# Patient Record
Sex: Female | Born: 1935 | Race: Black or African American | Hispanic: No
Health system: Southern US, Community
[De-identification: ages and names within clinical notes are randomized; demographics above are authoritative.]

---

## 2004-09-20 ENCOUNTER — Other Ambulatory Visit: Payer: Self-pay

## 2004-09-20 ENCOUNTER — Emergency Department: Payer: Self-pay | Admitting: Emergency Medicine

## 2004-12-03 ENCOUNTER — Emergency Department: Payer: Self-pay | Admitting: Emergency Medicine

## 2005-09-18 ENCOUNTER — Ambulatory Visit: Payer: Self-pay | Admitting: Ophthalmology

## 2005-10-02 ENCOUNTER — Ambulatory Visit: Payer: Self-pay | Admitting: Ophthalmology

## 2005-10-10 ENCOUNTER — Ambulatory Visit: Payer: Self-pay | Admitting: Ophthalmology

## 2006-12-04 ENCOUNTER — Other Ambulatory Visit: Payer: Self-pay

## 2006-12-04 ENCOUNTER — Emergency Department: Payer: Self-pay | Admitting: Internal Medicine

## 2009-04-17 ENCOUNTER — Emergency Department: Payer: Self-pay | Admitting: Emergency Medicine

## 2010-06-06 ENCOUNTER — Ambulatory Visit: Payer: Self-pay | Admitting: *Deleted

## 2011-05-22 ENCOUNTER — Other Ambulatory Visit (HOSPITAL_COMMUNITY): Payer: Self-pay | Admitting: Internal Medicine

## 2011-05-22 ENCOUNTER — Other Ambulatory Visit (HOSPITAL_COMMUNITY): Payer: Self-pay | Admitting: *Deleted

## 2011-05-22 DIAGNOSIS — Z1231 Encounter for screening mammogram for malignant neoplasm of breast: Secondary | ICD-10-CM

## 2011-05-24 ENCOUNTER — Ambulatory Visit (HOSPITAL_COMMUNITY): Admission: RE | Admit: 2011-05-24 | Payer: Self-pay | Source: Ambulatory Visit

## 2011-05-29 ENCOUNTER — Other Ambulatory Visit: Payer: Self-pay | Admitting: Family Medicine

## 2011-09-20 ENCOUNTER — Emergency Department: Payer: Self-pay | Admitting: Emergency Medicine

## 2013-01-03 ENCOUNTER — Emergency Department: Payer: Self-pay | Admitting: Emergency Medicine

## 2013-01-03 LAB — COMPREHENSIVE METABOLIC PANEL
Albumin: 2.6 g/dL — ABNORMAL LOW (ref 3.4–5.0)
Anion Gap: 8 (ref 7–16)
BUN: 19 mg/dL — ABNORMAL HIGH (ref 7–18)
Calcium, Total: 8 mg/dL — ABNORMAL LOW (ref 8.5–10.1)
Chloride: 101 mmol/L (ref 98–107)
Co2: 24 mmol/L (ref 21–32)
EGFR (African American): 44 — ABNORMAL LOW
EGFR (Non-African Amer.): 38 — ABNORMAL LOW
Potassium: 3.1 mmol/L — ABNORMAL LOW (ref 3.5–5.1)
Total Protein: 6.9 g/dL (ref 6.4–8.2)

## 2013-01-03 LAB — URINALYSIS, COMPLETE
Leukocyte Esterase: NEGATIVE
Ph: 5 (ref 4.5–8.0)
Protein: NEGATIVE
Specific Gravity: 1.008 (ref 1.003–1.030)
WBC UR: 1 /HPF (ref 0–5)

## 2013-01-03 LAB — CBC
HCT: 28.2 % — ABNORMAL LOW (ref 35.0–47.0)
MCH: 26.6 pg (ref 26.0–34.0)
MCHC: 33.3 g/dL (ref 32.0–36.0)
MCV: 80 fL (ref 80–100)
RBC: 3.53 10*6/uL — ABNORMAL LOW (ref 3.80–5.20)
WBC: 4.7 10*3/uL (ref 3.6–11.0)

## 2013-01-03 LAB — TROPONIN I: Troponin-I: <0.02 ng/mL

## 2013-06-22 ENCOUNTER — Emergency Department: Payer: Self-pay | Admitting: Emergency Medicine

## 2013-06-22 LAB — CBC WITH DIFFERENTIAL/PLATELET
BASOS PCT: 0.8 %
Basophil #: 0 10*3/uL (ref 0.0–0.1)
EOS PCT: 1.6 %
Eosinophil #: 0.1 10*3/uL (ref 0.0–0.7)
HCT: 27.7 % — AB (ref 35.0–47.0)
HGB: 9 g/dL — AB (ref 12.0–16.0)
Lymphocyte #: 0.5 10*3/uL — ABNORMAL LOW (ref 1.0–3.6)
Lymphocyte %: 16.1 %
MCH: 28.2 pg (ref 26.0–34.0)
MCHC: 32.5 g/dL (ref 32.0–36.0)
MCV: 87 fL (ref 80–100)
MONO ABS: 0.2 x10 3/mm (ref 0.2–0.9)
MONOS PCT: 7.2 %
NEUTROS PCT: 74.3 %
Neutrophil #: 2.4 10*3/uL (ref 1.4–6.5)
Platelet: 302 10*3/uL (ref 150–440)
RBC: 3.18 10*6/uL — ABNORMAL LOW (ref 3.80–5.20)
RDW: 16.5 % — ABNORMAL HIGH (ref 11.5–14.5)
WBC: 3.2 10*3/uL — AB (ref 3.6–11.0)

## 2013-06-22 LAB — PRO B NATRIURETIC PEPTIDE: B-TYPE NATIURETIC PEPTID: 1010 pg/mL — AB (ref 0–450)

## 2013-06-22 LAB — BASIC METABOLIC PANEL
ANION GAP: 7 (ref 7–16)
BUN: 28 mg/dL — ABNORMAL HIGH (ref 7–18)
CHLORIDE: 101 mmol/L (ref 98–107)
CREATININE: 1.28 mg/dL (ref 0.60–1.30)
Calcium, Total: 8 mg/dL — ABNORMAL LOW (ref 8.5–10.1)
Co2: 22 mmol/L (ref 21–32)
EGFR (Non-African Amer.): 38 — ABNORMAL LOW
GFR CALC AF AMER: 44 — AB
Glucose: 151 mg/dL — ABNORMAL HIGH (ref 65–99)
OSMOLALITY: 269 (ref 275–301)
POTASSIUM: 4.7 mmol/L (ref 3.5–5.1)
SODIUM: 130 mmol/L — AB (ref 136–145)

## 2013-06-22 LAB — TROPONIN I: Troponin-I: <0.02 ng/mL

## 2013-08-07 ENCOUNTER — Ambulatory Visit: Payer: Self-pay | Admitting: Internal Medicine

## 2013-09-03 ENCOUNTER — Inpatient Hospital Stay: Payer: Self-pay | Admitting: Internal Medicine

## 2013-09-03 LAB — BASIC METABOLIC PANEL
Anion Gap: 12 (ref 7–16)
BUN: 76 mg/dL — AB (ref 7–18)
Calcium, Total: 7.7 mg/dL — ABNORMAL LOW (ref 8.5–10.1)
Chloride: 107 mmol/L (ref 98–107)
Co2: 20 mmol/L — ABNORMAL LOW (ref 21–32)
Creatinine: 4.91 mg/dL — ABNORMAL HIGH (ref 0.60–1.30)
EGFR (African American): 9 — ABNORMAL LOW
EGFR (Non-African Amer.): 7 — ABNORMAL LOW
GLUCOSE: 85 mg/dL (ref 65–99)
OSMOLALITY: 299 (ref 275–301)
POTASSIUM: 3.6 mmol/L (ref 3.5–5.1)
Sodium: 139 mmol/L (ref 136–145)

## 2013-09-03 LAB — CBC
HCT: 26.2 % — AB (ref 35.0–47.0)
HGB: 8.5 g/dL — AB (ref 12.0–16.0)
MCH: 30 pg (ref 26.0–34.0)
MCHC: 32.6 g/dL (ref 32.0–36.0)
MCV: 92 fL (ref 80–100)
Platelet: 256 10*3/uL (ref 150–440)
RBC: 2.85 10*6/uL — ABNORMAL LOW (ref 3.80–5.20)
RDW: 14 % (ref 11.5–14.5)
WBC: 3.2 10*3/uL — AB (ref 3.6–11.0)

## 2013-09-03 LAB — PRO B NATRIURETIC PEPTIDE: B-Type Natriuretic Peptide: 2390 pg/mL — ABNORMAL HIGH (ref 0–450)

## 2013-09-03 LAB — TROPONIN I: TROPONIN-I: 0.02 ng/mL

## 2013-09-04 DIAGNOSIS — E8809 Other disorders of plasma-protein metabolism, not elsewhere classified: Secondary | ICD-10-CM

## 2013-09-04 DIAGNOSIS — N179 Acute kidney failure, unspecified: Secondary | ICD-10-CM

## 2013-09-04 DIAGNOSIS — D649 Anemia, unspecified: Secondary | ICD-10-CM

## 2013-09-04 DIAGNOSIS — R609 Edema, unspecified: Secondary | ICD-10-CM

## 2013-09-04 DIAGNOSIS — I319 Disease of pericardium, unspecified: Secondary | ICD-10-CM

## 2013-09-04 LAB — URINALYSIS, COMPLETE
Bilirubin,UR: NEGATIVE
GLUCOSE, UR: NEGATIVE mg/dL (ref 0–75)
KETONE: NEGATIVE
Nitrite: NEGATIVE
Ph: 5 (ref 4.5–8.0)
Protein: NEGATIVE
Specific Gravity: 1.012 (ref 1.003–1.030)
WBC UR: 7 /HPF (ref 0–5)

## 2013-09-04 LAB — CBC WITH DIFFERENTIAL/PLATELET
BASOS PCT: 1 %
Basophil #: 0 10*3/uL (ref 0.0–0.1)
Eosinophil #: 0.1 10*3/uL (ref 0.0–0.7)
Eosinophil %: 3.2 %
HCT: 26.1 % — ABNORMAL LOW (ref 35.0–47.0)
HGB: 8.6 g/dL — ABNORMAL LOW (ref 12.0–16.0)
LYMPHS PCT: 18.5 %
Lymphocyte #: 0.5 10*3/uL — ABNORMAL LOW (ref 1.0–3.6)
MCH: 30.2 pg (ref 26.0–34.0)
MCHC: 33 g/dL (ref 32.0–36.0)
MCV: 92 fL (ref 80–100)
MONOS PCT: 9.7 %
Monocyte #: 0.3 x10 3/mm (ref 0.2–0.9)
NEUTROS ABS: 1.8 10*3/uL (ref 1.4–6.5)
Neutrophil %: 67.6 %
Platelet: 249 10*3/uL (ref 150–440)
RBC: 2.86 10*6/uL — ABNORMAL LOW (ref 3.80–5.20)
RDW: 13.7 % (ref 11.5–14.5)
WBC: 2.7 10*3/uL — ABNORMAL LOW (ref 3.6–11.0)

## 2013-09-04 LAB — BASIC METABOLIC PANEL
ANION GAP: 10 (ref 7–16)
BUN: 77 mg/dL — AB (ref 7–18)
Calcium, Total: 7.7 mg/dL — ABNORMAL LOW (ref 8.5–10.1)
Chloride: 109 mmol/L — ABNORMAL HIGH (ref 98–107)
Co2: 19 mmol/L — ABNORMAL LOW (ref 21–32)
Creatinine: 4.49 mg/dL — ABNORMAL HIGH (ref 0.60–1.30)
EGFR (African American): 10 — ABNORMAL LOW
EGFR (Non-African Amer.): 8 — ABNORMAL LOW
GLUCOSE: 71 mg/dL (ref 65–99)
Osmolality: 297 (ref 275–301)
POTASSIUM: 3.6 mmol/L (ref 3.5–5.1)
Sodium: 138 mmol/L (ref 136–145)

## 2013-09-04 LAB — HEPATIC FUNCTION PANEL A (ARMC)
ALT: 11 U/L — AB (ref 12–78)
AST: 19 U/L (ref 15–37)
Albumin: 1.6 g/dL — ABNORMAL LOW (ref 3.4–5.0)
Alkaline Phosphatase: 101 U/L
BILIRUBIN TOTAL: 0.2 mg/dL (ref 0.2–1.0)
TOTAL PROTEIN: 5.6 g/dL — AB (ref 6.4–8.2)

## 2013-09-04 LAB — TROPONIN I

## 2013-09-04 LAB — HEMOGLOBIN A1C: Hemoglobin A1C: 6 % (ref 4.2–6.3)

## 2013-09-04 LAB — TSH: Thyroid Stimulating Horm: 2.22 u[IU]/mL

## 2013-09-04 LAB — SODIUM, URINE, RANDOM: Sodium, Urine Random: 34 mmol/L (ref 20–110)

## 2013-09-04 LAB — MAGNESIUM: Magnesium: 1.5 mg/dL — ABNORMAL LOW

## 2013-09-04 LAB — OSMOLALITY, URINE: Osmolality: 393 mOsm/kg

## 2013-09-04 LAB — PROTEIN, URINE, RANDOM: Protein, Random Urine: 36 mg/dL — ABNORMAL HIGH (ref 0–12)

## 2013-09-04 LAB — CREATININE, URINE, RANDOM: CREATININE, URINE RANDOM: 120.9 mg/dL (ref 30.0–125.0)

## 2013-09-05 ENCOUNTER — Ambulatory Visit: Payer: Self-pay | Admitting: Urology

## 2013-09-05 LAB — BASIC METABOLIC PANEL
Anion Gap: 9 (ref 7–16)
BUN: 73 mg/dL — ABNORMAL HIGH (ref 7–18)
CO2: 19 mmol/L — AB (ref 21–32)
Calcium, Total: 7.4 mg/dL — ABNORMAL LOW (ref 8.5–10.1)
Chloride: 110 mmol/L — ABNORMAL HIGH (ref 98–107)
Creatinine: 4.32 mg/dL — ABNORMAL HIGH (ref 0.60–1.30)
EGFR (Non-African Amer.): 9 — ABNORMAL LOW
GFR CALC AF AMER: 10 — AB
Glucose: 100 mg/dL — ABNORMAL HIGH (ref 65–99)
Osmolality: 297 (ref 275–301)
Potassium: 3.7 mmol/L (ref 3.5–5.1)
SODIUM: 138 mmol/L (ref 136–145)

## 2013-09-06 LAB — BASIC METABOLIC PANEL
Anion Gap: 11 (ref 7–16)
BUN: 65 mg/dL — ABNORMAL HIGH (ref 7–18)
CALCIUM: 7.4 mg/dL — AB (ref 8.5–10.1)
CO2: 18 mmol/L — AB (ref 21–32)
Chloride: 109 mmol/L — ABNORMAL HIGH (ref 98–107)
Creatinine: 3.61 mg/dL — ABNORMAL HIGH (ref 0.60–1.30)
EGFR (Non-African Amer.): 11 — ABNORMAL LOW
GFR CALC AF AMER: 12 — AB
GLUCOSE: 104 mg/dL — AB (ref 65–99)
OSMOLALITY: 295 (ref 275–301)
POTASSIUM: 3.7 mmol/L (ref 3.5–5.1)
SODIUM: 138 mmol/L (ref 136–145)

## 2013-09-07 ENCOUNTER — Ambulatory Visit: Payer: Self-pay | Admitting: Internal Medicine

## 2013-09-07 LAB — CREATININE, SERUM
CREATININE: 3.25 mg/dL — AB (ref 0.60–1.30)
EGFR (African American): 14 — ABNORMAL LOW
EGFR (Non-African Amer.): 12 — ABNORMAL LOW

## 2013-09-07 LAB — PROTEIN ELECTROPHORESIS(ARMC)

## 2013-09-08 LAB — PLATELET COUNT: Platelet: 199 10*3/uL (ref 150–440)

## 2013-09-08 LAB — UR PROT ELECTROPHORESIS, URINE RANDOM

## 2013-09-08 LAB — CREATININE, SERUM
CREATININE: 2.52 mg/dL — AB (ref 0.60–1.30)
GFR CALC AF AMER: 19 — AB
GFR CALC NON AF AMER: 17 — AB

## 2013-09-10 LAB — PATHOLOGY REPORT

## 2013-10-07 DEATH — deceased

## 2014-07-31 NOTE — Consult Note (Signed)
PATIENT NAME:  Haley Holland, Haley Holland MR#:  [id number] DATE OF BIRTH:  August 02, 1923  DATE OF CONSULTATION:  09/04/2013  UROLOGY CONSULTATION  CONSULTING PHYSICIAN:  Caralyn Guileichard Holland. Edwyna ShellHart, DO  I was asked to see the patient at the behest of, I believe, Dr. Welton FlakesKhan.  The patient comes in with known metastatic uterine cancer.  I see at least one metastasis to her liver and she has a right hydronephrosis with an increasing creatinine of up to 4.7. She is beginning to having urine output. She has been given some stimulation of her heart and medication for weakness. She is known to have heart failure, diabetes. Her creatinine on admission in the ER was 4.9, and it was normal back in March 2015.   PAST MEDICAL HISTORY: Hypertension, diabetes, hyperglycemia, history of uterine cancer status post chemotherapy and radiation. No known drug allergies. No smoking, alcohol or IV drugs. Lives alone, fairly independent. Mother is still alive at the age of 79.  No known medications at home.  REVIEW OF SYSTEMS:  No fever, fatigue, weakness. Lying curled up in bed, very fatigued. She has no blurred vision.  She has no mental problems with Alzheimer's or loss of memory or orientation.  GASTROINTESTINAL: No nausea, vomiting, diarrhea.  CARDIOVASCULAR: Positive for edema and dyspnea on exertion. No chest pain.  RESPIRATORY: No cough, wheezing, hemoptysis.  GENITOURINARY: No dysuria, hematuria. Does have acute on chronic renal failure.  ENDOCRINE: No polyuria or nocturia. HEMATOLOGY:  Anemia.  SKIN: No rashes or lesions.  MUSCULOSKELETAL: She has arthritis that is mostly back and extremities.  NEUROLOGIC: No numbness.  PSYCHIATRIC: She has a very positive psychiatric history and very optimistic.   PHYSICAL EXAM: VITAL SIGNS: Stable. GENERAL: She is an 81 year old female lying in bed on her side with some arthritic-type contractures. She is very thin, cachectic-appearing.  HEENT: Pupils are round, equal and reactive to  light. NECK:  Supple.  LUNGS: Clear to auscultation. EXTREMITIES:  Only 1 to 2 edema. She has been lying. NEUROLOGIC:  Cranial nerves II through XII are intact. Oriented to person, place and time. No fevers or lesions.   IMPRESSION: My plan is to keep her nothing by mouth tonight, as she ate at noon today and I will be gone before 8:00; and 8:00 at night is no time to do a stent. She probably needs a right-sided stent.  With the creatinine coming down, I think she is beginning to void but hydration has probably helped her, plus whatever has been given to her for her heart failure. Her hemoglobin is 8.6, hematocrit 26.1. I think this is probably baseline for her.  Consideration for blood products is certainly an issue at this point with her heart failure and pulmonary edema. But I will order her nothing by mouth midnight and have the urologist consult in the morning.   ____________________________ Caralyn Guileichard Holland. Edwyna ShellHart, DO rdh:ce Holland: 09/04/2013 17:08:43 ET T: 09/04/2013 21:20:53 ET JOB#: [id number]  cc: Caralyn Guileichard Holland. Edwyna ShellHart, DO, <Dictator> RICHARD Holland HART DO ELECTRONICALLY SIGNED 09/11/2013 13:24

## 2014-07-31 NOTE — H&P (Signed)
PATIENT NAME:  Haley Holland, DIGUGLIELMO MR#:  [id number] DATE OF BIRTH:  16-Dec-1923  DATE OF ADMISSION:  09/03/2013   PRIMARY CARE PHYSICIAN:  Dr. Vicenta Dunning   CARDIOLOGY:  Dr. Adrian Blackwater     CHIEF COMPLAINT: Weakness.   HISTORY OF PRESENT ILLNESS: The patient is an 81 year old female with a known history of heart failure, hypertension, diabetes, and uterine cancer who is being admitted for suspected cardiorenal syndrome. The patient has been feeling weak and has been having worsening lower extremity swelling for weeks. Has been followed by home health nurse and she was asked to come to the Emergency Department as she kept feeling worse and more and more weak. While in the ED, she was found to have acute renal failure with creatinine of 4.91, which was normal back in March 2015. She is being admitted for further evaluation and management. The patient reports feeling short of breath at times and unable to do her daily activities of living. She was also found to be hypotensive with blood pressure in 90s over 70s. She denies any chest pain, fever, nausea or vomiting.   PAST MEDICAL HISTORY:  1. Hypertension.  2. Diabetes.  3. Hyperlipidemia.  4. History of uterine cancer status post chemotherapy and radiation therapy.   ALLERGIES: No known drug allergies.   SOCIAL HISTORY: No smoking, alcohol, or  IV  drugs of abuse. She lives alone and has been fairly independent, managing on her own.  FAMILY HISTORY: Mother is still alive at the age of 58. She does have heart disease.   MEDICATIONS AT HOME: None.  MEDICATION ALLERGIES:  No known drug allergies.   REVIEW OF SYSTEMS:  CONSTITUTIONAL: No fever. Positive fatigue and weakness.  EYES: No blurred or double vision.  EARS, NOSE, AND THROAT: No tinnitus, ear pain.  RESPIRATORY: No cough, wheezing, hemoptysis.  CARDIOVASCULAR: Positive for edema and dyspnea on exertion. No chest pain, orthopnea.  GASTROINTESTINAL: No nausea, vomiting,  diarrhea. GENITOURINARY:  No dysuria or hematuria.  ENDOCRINE: No polyuria or nocturia. HEMATOLOGY: Positive for anemia. No easy bruising.  SKIN: No rash or lesion.  MUSCULOSKELETAL: Possible likely arthritis. No muscle cramps.  NEUROLOGIC: No tingling, numbness, weakness.  PSYCHIATRIC: No history of anxiety, depression.   PHYSICAL EXAMINATION:  VITAL SIGNS: Temperature 97.6, respirations 18 per minute, blood pressure 92/65 mmhg.   She is saturating 98% on room air. GENERAL: Patient is an 81 year old female lying in the bed comfortably without any acute distress.  EYES: Pupils equal, round, reactive to light.  Shows no scleral icterus. Extraocular muscles intact.  HEENT: Head atraumatic, normocephalic.  Oropharynx and nasopharynx clear. NECK: Supple, No JVD, No thyroid enlargement or tednerness.  LUNGS:  Clear to auscultation bilaterally. No wheeze, rales, rhonchi.  CARDIOVASCULAR: S1, S2 normal. No murmurs, rubs or gallops.  ABDOMEN: Soft, nontender, nondistended. Bowel sounds present. No Organomegaly appreciated.  EXTREMITIES: 2 to 3+ pedal edema bilateral ankles. No cyanosis or clubbing. NEUROLOGIC: Cranial nerves II-XII intact, Motor strength is 5/5.  Sensations intact.   PSYCHIATRIC:  Patient is alert and oriented x 3.   SKIN:  No obvious rash, lesion or ulcer.  LABORATORY PANEL: Normal BMP except BUN of 76, creatinine 4.91, blood sugar of 85. BNP of 2390. CBC showed white count 3.2, hemoglobin 8.5, hematocrit 26.2, platelets 256,000.   RADIOLOGICAL DATA: Chest x-ray in the ED showed moderate left and small right pleural effusion and atelectasis.   LE Dopplers in the ED showed no evidence of DVT.   EKG has  not been obtained.   IMPRESSION AND PLAN:  1. Suspected cardiorenal syndrome with acute renal failure with underlying congestive heart failure.  We will consult cardiology and nephrology.  We will start her on dopamine at renal dose for helping renal perfusion, avoid  hypotension. 2. Acute renal failure, likely acute tubular necrosis and/or prerenal.  We will obtain renal ultrasound, consult nephrology, avoid any nephrotoxic medications, start her on IV hydration. She is able to urinate so we will hold off putting a Foley. 3. Anemia of chronic disease, likely from underlying cancer. She does have a history of uterine cancer. We will monitor at this time.  4. Suspected congestive heart failure with lower extremity edema, elevated BNP and chest x-ray finding.  We will obtain cardiology consult, discuss with Dr. Adrian BlackwaterShaukat Khan.  We will get 2-D echo. Consider Lasix drip with albumin infusion as per cardiology and nephrology tomorrow as appropriate.  Small left heel ulcer one half to 1 cm, noninfected. Continue dressing changes as needed.  CODE STATUS: DNR.   We will get palliative care consultation.  TOTAL TIME TAKING CARE OF THIS PATIENT: (Critical care) 55 minutes.  She remains at high risk for a cardiorespiratory arrest and will have a poor outcome.  ____________________________ Hiran Leard S. Sherryll BurgerShah, MD vss:dd D: 09/03/2013 19:50:08 ET T: 09/03/2013 21:28:36 ET JOB#: [id number]  cc: Woodrow Dulski S. Sherryll BurgerShah, MD, <Dictator> Jillene Bucksenny C. Arlana Pouchate, MD Laurier NancyShaukat A. Khan, MD  Ellamae SiaVIPUL S [facility] MD ELECTRONICALLY SIGNED 09/04/2013 10:52

## 2014-07-31 NOTE — Discharge Summary (Signed)
PATIENT NAME:  Haley Holland, Haley Holland MR#:  [id number] DATE OF BIRTH:  [birth year]  DATE OF ADMISSION:  09/03/2013 DATE OF DISCHARGE:    PRESENTING COMPLAINT: Weakness, swelling of the legs.   DISCHARGE DIAGNOSES: 1.  Acute renal failure appearing from obstructive uropathy and mild dehydration with right-sided hydronephrosis from pelvic mass effect. The patient is status post ureteral stent placement on May 30th.   Creatinine down to 2.5; creatinine at admission was 4.91.  2.  Hypotension, shock, improved from hypovolemia, improved.  4.  Bilateral lower extremity edema with bilateral pleural effusions, likely secondary to low albumin, large mass effect on large vessels in the pelvis and severe protein calorie malnutrition.  5.  History of known uterine cancer, follows at [facility] gynecology/oncology. The patient has additional left pelvic mass, ascites and liver mass, which appears to be vascular.  6.  History of deep vein thrombosis off Xarelto. The patient has had Doppler in March 2015 and in May 2015 that was negative for deep vein thrombosis.  7.  Generalized weakness, deconditioning and severe protein calorie malnutrition.   CODE STATUS: FULL CODE.   The patient is being discharged to a skilled facility in [facility].   DISCHARGE MEDICATIONS:  1.  Timolol 0.25% 1 drop to both eyes b.i.d.  2.  Ferrous sulfate 325 mg p.o. daily.  3.  Megestrol 40 mg/mL, 10 mL b.i.d.  4.  Acetaminophen 325 mg 2 tablets every 8 hours as needed.  5.  Magnesium oxide 400 mg p.o. daily.  6.  Tetrahydrozoline 0.05% ophthalmic drops to affected eyes every 6 hourly.  7.  Ensure Clear 200 mL 3 times a day.   Physical therapy.   Regular diet.   Follow up with Dr. Arlana Pouch in 2 to 4 weeks.   The patient will follow up with her primary oncologist, Dr. Oneita Jolly at [facility] oncology.   The patient will follow up with urology in 2 to 4 weeks at [facility] regarding followup of right ureteral stent placement.   Creatinine at  discharge is 2.52. Potassium is 3.7. Sodium is 138. Echo Doppler of the heart showed EF 60% to 65%, impaired relaxation pattern of LV diastolic filling, normal global left ventricular systolic function, moderate-sized pericardial effusion, which is anterior to the right ventricle; mild to moderate aortic valve sclerosis without evidence of stenosis. No evidence of pulmonary hypertension.   Ultrasound of kidneys, bilateral, ascites, solid, 4.7 x 4.7 x 5 cm vascular liver mass.   Ultrasound of the pelvis showed 18.9 x 16.3 x 21.9 cm avascular complex solid mass in the pelvis, adjacent to 7.9 x 3.8 x 7.4 mass in the left pelvis concerning for neoplasm.   CHEST X-RAY: Small left pleural effusion with left basilar ascites or infiltrate.   UA negative for UTI.  UPEP M-spike not observed. Total protein urine is 19 mg/dL.    ANA is negative.   SPEP total protein is 5.2. Albumin is 2.0. Betaglobulin is 0.5. Gammaglobulin is 2.1. M-spike not observed. H and H are 8.6 and 26.1. A1c is 6.0. TSH is 2.22. LFTs within normal limits. Albumin is 1.6. Ultrasound Doppler, May 28th, is negative for DVT.  CONSULTATIONS: 1.  Cardiology, Dr. Kirke Corin.  2.  Urology, Dr. Edwyna Shell and Yevonne Aline. 3.  Palliative care, Dr. Harvie Junior.  4.  Nephrology, Dr. Thedore Mins.   BRIEF SUMMARY OF HOSPITAL COURSE: An 81 year old [name] was admitted on May 28th with increasing shortness of breath, weakness and leg swelling. She was found to have:  1.  Acute renal failure, suspected due to obstructive uropathy secondary from known large pelvic and uterine mass effect. The patient has history of CKD stage III, looking at creatinine in March 2015. She, on ultrasound, shows severe right-sided hydronephrosis. She underwent right ureteral stent placement on May 30th. Her creatinine came down from 4.91 at admission to 2.5. She did get IV fluids. The patient will [facility] for [facility] of her ureteral stent in the next 2 to 4 weeks.   2.  Hypotension/hypovolemic shock, improving, suspected from hypovolemia. No signs of infection. Blood pressure is stable. Discontinue the IV fluids.  3.  Bilateral lower extremity edema with bilateral pleural effusions likely from low albumin, large mass effect on large vessels in the pelvis from the pelvic mass, along with protein calorie malnutrition. She does not have CHF.  Her EF is 60% to 65%. The patient was seen by Dr. Kirke CorinArida.  4.  History of uterine cancer with known chemo treatment in the past. Declined any further aggressive treatment. She will [facility] up [facility]. She also has a left pelvic mass, uterine mass, ascites and liver mass, which appears to be vascular.  5.  History of deep vein thrombosis. The patient had been on Xarelto. Her 2 ultrasounds of lower extremities in March and May of 2015 have been negative. She was taken off the blood thinner medication.  6.  Deconditioning, weakness. Physical therapy recommends rehab. The patient is going [place].  7.  Severe protein calorie malnutrition. Calorie count was initiated. The patient was started on Megace, and the patient's appetite was improving slowly.   DISCHARGE DISPOSITION: I had very lengthy conversations with the patient's three daughters. It was a difficult situation since daughters [facility]. Made attempt to transfer to internal medicine and OB/GYN and was declined, since there was no indications for any transfer at this time, given overall patient's  comorbidities and overall prognosis. The patient experienced nursing director on Tuesday and palliative care was involved in the patient's care. Hospital stay otherwise remained stable. The patient remained a FULL CODE.   TIME SPENT:  40 minutes.  ____________________________ Wylie HailSona A. Allena KatzPatel, MD sap:dmm D: 09/09/2013 12:13:32  ET T: 09/09/2013 13:07:42 ET JOB#: [id number]  cc: Kirstan Fentress A. Allena KatzPatel, MD, <Dictator> Willow OraSONA A Havanna Groner MD ELECTRONICALLY SIGNED 09/21/2013 14:16

## 2014-07-31 NOTE — Consult Note (Signed)
Chief Complaint:  Subjective/Chief Complaint naeon. still feels weak. no flank pain   VITAL SIGNS/ANCILLARY NOTES: **Vital Signs.:   31-May-15 08:35  Vital Signs Type Q 4hr  Temperature Temperature (F) 97.4  Celsius 36.3  Temperature Source oral  Pulse Pulse 91  Respirations Respirations 18  Systolic BP Systolic BP 960  Diastolic BP (mmHg) Diastolic BP (mmHg) 66  Mean BP 78  Pulse Ox % Pulse Ox % 96  Pulse Ox Activity Level  At rest  Oxygen Delivery Room Air/ 21 %  *Intake and Output.:   Daily 31-May-15 07:00  Grand Totals Intake:  925 Output:  725    Net:  200 24 Hr.:  200  Oral Intake      In:  0  IV (Primary)      In:  925  Urine ml     Out:  725  Length of Stay Totals Intake:  3190.7 Output:  975    Net:  2215.7   Brief Assessment:  GEN no acute distress, cachectic   Cardiac Regular   Respiratory normal resp effort  clear BS   Gastrointestinal details normal Soft  Nontender  Nondistended  palpable pelvic mass   Lab Results: Routine Chem:  31-May-15 05:30   Glucose, Serum  104  BUN  65  Creatinine (comp)  3.61  Sodium, Serum 138  Potassium, Serum 3.7  Chloride, Serum  109  CO2, Serum  18  Calcium (Total), Serum  7.4  Anion Gap 11  Osmolality (calc) 295  eGFR (African American)  12  eGFR (Non-African American)  11 (eGFR values <78m/min/1.73 m2 may be an indication of chronic kidney disease (CKD). Calculated eGFR is useful in patients with stable renal function. The eGFR calculation will not be reliable in acutely ill patients when serum creatinine is changing rapidly. It is not useful in  patients on dialysis. The eGFR calculation may not be applicable to patients at the low and high extremes of body sizes, pregnant women, and vegetarians.)   Assessment/Plan:  Assessment/Plan:  Assessment 81y/o F with large recurrent uterine cancer causing ureteral obstruction, s/p right ureteral placement with improvement in Cr to 3.6 today from 4.3   Plan -  continue stent and trend Cr. if Cr doesn't come to baseline, will have to consider nephrostomy tube. d/w family and not sure she would want to pursue this. palliative care consult which is ordered may be helpful to help with these decisions if needed in the future - okay to remove foley from urology standpoint - stent is temporary and will need exchange in 3 months. patient can f/u with Dr. HElnoria Howardas outpatient. discussed with patient and daughters that the stent is temporary and needs removal or exchange within 3 months   Electronic Signatures: SCharna Archer(MD)  (Signed 31-May-15 09:41)  Authored: Chief Complaint, VITAL SIGNS/ANCILLARY NOTES, Brief Assessment, Lab Results, Assessment/Plan   Last Updated: 31-May-15 09:41 by SCharna Archer(MD)

## 2014-07-31 NOTE — Consult Note (Signed)
Chief Complaint:  Subjective/Chief Complaint naeon. no flank pain. npo for stent today   VITAL SIGNS/ANCILLARY NOTES: **Vital Signs.:   30-May-15 09:00  Pulse Pulse 82  Respirations Respirations 19  Systolic BP Systolic BP 220  Diastolic BP (mmHg) Diastolic BP (mmHg) 58  Mean BP 73  Pulse Ox % Pulse Ox % 100  Pulse Ox Activity Level  At rest  Oxygen Delivery Room Air/ 21 %   Brief Assessment:  GEN no acute distress, cachectic   Cardiac Regular   Respiratory normal resp effort   Gastrointestinal details normal Soft  Nontender  Nondistended  no CVAT. palpable fullness to lower abd   Lab Results:  Routine Chem:  30-May-15 04:18   Glucose, Serum  100  BUN  73  Creatinine (comp)  4.32  Sodium, Serum 138  Potassium, Serum 3.7  Chloride, Serum  110  CO2, Serum  19  Calcium (Total), Serum  7.4  Anion Gap 9  Osmolality (calc) 297  eGFR (African American)  10  eGFR (Non-African American)  9 (eGFR values <61m/min/1.73 m2 may be an indication of chronic kidney disease (CKD). Calculated eGFR is useful in patients with stable renal function. The eGFR calculation will not be reliable in acutely ill patients when serum creatinine is changing rapidly. It is not useful in  patients on dialysis. The eGFR calculation may not be applicable to patients at the low and high extremes of body sizes, pregnant women, and vegetarians.)   Radiology Results: [place]    29-May-15 12:33, [place] Bilateral  [place] Bilateral   REASON FOR EXAM:    acute renal failure  COMMENTS:       PROCEDURE: [place] - [place]  - Sep 04 2013 12:33PM     CLINICAL DATA:  Acute renal failure.    EXAM:  RENAL/URINARY TRACT ULTRASOUND COMPLETE    COMPARISON:  CT of the abdomen and pelvis December 04, 2006.    FINDINGS:  Right Kidney:  Length: 11.7 cm.  Severe right hydronephrosis.    Left Kidney:    Length: 10.1 cm. Echogenicity within normal limits. No mass or  hydronephrosis  visualized.    Bladder:    Left ureteral jet identified, the right not seen. Patient was unable  to void all involution.    Incidental note of a 4.7 x 4.7 x 5 cm partially characterized  vascular mass within the liver. Anechoic ascites noted.   IMPRESSION:  Severe right hydronephrosis, with absent right bladder ureteral jets  consistent with obstructive uropathy, likely by known pelvic mass.    Ascites.    Solid 4.7 x 4.7 x 5 cm vascular liver mass.    Constellation of findings would be better characterized on CT of the  abdomen and pelvis with contrast, as clinically indicated.      Electronically Signed    By: CElon Alas   On: 09/04/2013 12:53     Verified By: CRicky Ala M.D.,   Assessment/Plan:  Assessment/Plan:  Assessment 81y/o F with metastatic uterine cancer with pelvic mass obstructing right kidney causing AKI and hydro   Plan - will plan for cysto, RPG, and attempt at right stent placement. d/w patient and daughter last night. possibility stent unable to be passed and/or will fail given her malignancy. pt and daughter would like to proceed. discussed risks and benefits including infection, bleeding, damage to organs, inability to pass stent, general risks of anesthesia, possiblity of neph tube at later date. will keep  npo and plan for case this am. will order periop Abx for prophylaxis.   Electronic Signatures: Charna Archer (MD)  (Signed [id number] 09:35)  Authored: Chief Complaint, VITAL SIGNS/ANCILLARY NOTES, Brief Assessment, Lab Results, Radiology Results, Assessment/Plan   Last Updated: 30-May-15 09:35 by Charna Archer (MD)

## 2014-07-31 NOTE — Op Note (Signed)
PATIENT NAME:  Haley Holland, Haley Holland MR#:  [id number] DATE OF BIRTH:  [birth year]  DATE OF PROCEDURE:  09/05/2013  ATTENDING SURGEON: Baxter Flattery, MD  PREOPERATIVE DIAGNOSES: 1. Severe right hydronephrosis.  2. Acute kidney injury.  3. Metastatic uterine cancer.  4. Large pelvic mass.   POSTOPERATIVE DIAGNOSES: 1. Severe right hydronephrosis.  2. Acute kidney injury.  3. Metastatic uterine cancer.  4. Large pelvic mass.   PROCEDURES PERFORMED:  1. Cystourethroscopy with right 6 French x 24-cm Contour ureteral stent placement.  2. Bladder biopsy x 1 with fulguration. 3. Right retrograde pyelogram. 4. Interpretation of urography.  5. Intraoperative fluoroscopy with total time length of 1 hour. 6. Simple Foley catheter placement.   COMPLICATIONS: None.   SPECIMENS: Bladder biopsy x 1.   DRAINS: Ureteral stent as above and 16 French Foley catheter.   ANESTHESIA: sedation.   FINDINGS: The bladder itself was somewhat inflamed likely from the pelvic mass. Her ureters were somewhat distorted in placement and I found the left ureter first and shot a retrograde pyelogram over here which noted no hydronephrosis. On the right side there was moderate hydronephrosis with a significant J hook and a proximal ureter as well as narrowing of her mid and distal ureter likely from external compressions. I was able to get a 6 x 24 stent in good position. I biopsied 1 papillary lesion in the posterior bladder wall.   INDICATIONS FOR SURGERY: Ms. Pullman is a pleasant 81 year old female with a history of metastatic and recurrent uterine cancer, who underwent chemotherapy and radiation many many years ago. She presented to the hospital with generalized fatigue and weakness and was noted to have acute kidney injury with a creatinine of 4.9. A renal ultrasound showed there to be a right hydronephrosis. A pelvic ultrasound showed a large pelvic mass, which is clearly palpable on exam. After discussing the risks  and benefits of the operation and the options the patient and her family elected to proceed with cystoscopy and attempted ureteral stent placement.   OPERATIVE PROCEDURE: After the patient was correctly identified in preoperative holding, she was brought to the operating room and placed supine on the operating table. A preprocedure timeout was called. Cefazolin was administered. SCDs were applied. Conscious sedation was administered and preprocedure timeout was called. The patient was carefully placed in dorsal lithotomy position taking care to pad all pressure points. She was then sterilely prepped and draped in the usual fashion followed by a pre-incision  timeout. Ample lubrication with saline running a 21 French rigid cystoscope was advanced per urethra into the bladder. The bladder itself was somewhat irritated in appearance with some prominent blood vessels and the anatomy was mildly distorted by what appeared to be external compression. She had some friable tissue in her posterior bladder wall and 1 small papillary lesion. I biopsied this and then fulgurated it. I then spent some time attempting to localize her ureteral orifices. They were somewhat difficult to locate because of her distorted bladder anatomy and the first ureteral orifice I found appeared to be the left, but I shot a retrograde pyelogram near it to confirm. There was no hydroureter or hydronephrosis noted on this side. I then spent a little bit more time and ultimately identified the right ureteral orifice. I cannulated this with a sensor wire and passed an open-ended catheter over it. I shot a retrograde pyelogram and the mid and distal ureter were very narrowed and there was no significant passage of contrast proximally. I then repassed  the wire through the open end in an attempt to advance it further. I could not get it beyond the proximal ureter. I advanced an open-ended catheter a little bit further and shot another retrograde and  there was a significant J hook noted here up into a moderately-to-severe hydronephrotic right kidney. I then attempted to advance the wire again and was unable to get this through this. I then tried a glide wire but was unable to get this through the area. I then tried an angled sensor wire and was able to get it through the J hook and then straighten it out with the open-ended catheter. I then passed a 6 French x 24-cm Contour ureteral stent over the wire and up into the kidney. There was good curl noted on fluoroscopy in the renal pelvis and under direct visualization of the bladder. The bladder was then drained. There was no significant hematuria and the urine was in fact clear. A 16-French straight Foley catheter was placed into the bladder. The patient was then awoken from anesthesia without complications and transferred to the recovery room for postoperative care.   POSTOPERATIVE PLAN AND INSTRUCTIONS: I had a long discussion with the family about where to go from here. It remains to be seen whether or not the stent is going to work and I would not be surprised at all if it failed based on the size of her mass and the narrowness of her ureter likely from extrinsic compression. We then have to visit placing a nephrostomy tube and whether or not the patient wants to pursue this especially since she is not undergoing any further treatment for her cancer and this may impair her quality of life that is having a nephrostomy tube in place. I also discussed with them that this stent is temporary and will need to be exchanged in 3 months if it is not pulled out before then. I suggested a palliative care consult to help discuss some of these matters as well. I spent about 25 minutes talking with her daughters and they were appreciative of the care. I will sign her out at the beginning of the week so that the team can follow up on her bladder biopsies.    ____________________________ Lisabeth PickJohn P. Azriel Dancy,  MD jps:lt D: 09/05/2013 11:58:41 ET T: 09/05/2013 20:51:31 ET JOB#: [id number]  cc: Lisabeth PickJohn P. Macsen Nuttall, MD, <Dictator> Aloha GellJOHN P [facility] HospitalELPH MD ELECTRONICALLY SIGNED 09/06/2013 8:52

## 2014-07-31 NOTE — Consult Note (Signed)
Brief Consult Note: Diagnosis: ARF, leg edema.   Patient was seen by consultant.   Consult note dictated.   Comments: No clear evidence of heart failure (no S3, lung congestion or JVD). Awaiting echo results.  Albumin is significantly low which is likely contributing.  Should also consider pelvic /abdomin imaging to exclude IVC compression as a cause.  Continue hydration and low dose Dopamin for now.  Electronic Signatures: Lorine BearsArida, Kimberly Nieland (MD)  (Signed 29-May-15 08:20)  Authored: Brief Consult Note   Last Updated: 29-May-15 08:20 by Lorine BearsArida, Jery Hollern (MD)

## 2014-07-31 NOTE — Consult Note (Signed)
PATIENT NAME:  Haley Holland, Haley Holland MR#:  [id number] DATE OF BIRTH:  Jan 06, 1924  DATE OF CONSULTATION:  09/04/2013  REFERRING PHYSICIAN:  Max Sane, MD CONSULTING PHYSICIAN:  Alenna Russell Lilian Kapur, MD  REASON FOR CONSULTATION: Acute renal failure.   HISTORY OF PRESENT ILLNESS: The patient is a pleasant 81 year old African American female with past medical history of hypertension, diabetes mellitus, hyperlipidemia, history of uterine cancer status post chemotherapy and radiation therapy who presented to [facility] with weakness. The patient reports that the weakness has been rather progressive over the past 3 weeks. She has also had slowly increasing lower extremity edema. When asked about shortness of breath, the patient currently denies this. We are asked to see her for evaluation and management of acute renal failure. It appears that she has some element of chronic kidney disease. Her baseline creatinine is 1.28 with an eGFR of 44. When she presented, creatinine was 4.91 with an eGFR of 9. The patient has been started on IV fluid hydration and creatinine is down to 4.4 with an eGFR of 10. Renal ultrasound is currently pending this a.m. The patient appears to be quite hypoalbuminemic with an albumin of 1.6. She also appears to be anemic with a hemoglobin of 8.6. Urinalysis was performed, which was negative for protein or hematuria.   PAST MEDICAL HISTORY: 1.  Hypertension.  2.  Diabetes mellitus.  3.  Hyperlipidemia.  4.  Uterine cancer status post chemotherapy and radiation therapy.  5.  Chronic kidney disease, stage III, baseline eGFR of 44.   ALLERGIES: No known drug allergies.   CURRENT INPATIENT MEDICATIONS: Include: 1.  Normal saline 0.9 at 75 mL/h.  2.  Dopamine drip.  3.  Tylenol 650 mg p.o. q. 4 hours p.r.n.  4.  Zofran 4 mg IV q. 4 hours p.r.n.  5.  Magnesium sulfate 2 grams IV x1.  6.  Magnesium oxide 400 mg p.o. daily.   SOCIAL HISTORY: The patient lives in  [place]. She is widowed. She denies tobacco, alcohol, or illicit drug use.   FAMILY HISTORY: The patient's mother is alive at age 79. The patient's father is deceased and had history of underlying coronary artery disease.   REVIEW OF SYSTEMS: CONSTITUTIONAL: Denies fevers, chills or weight loss.  EYES: Denies diplopia, blurry vision.  HEENT: Denies headaches or hearing loss. Denies epistaxis.  CARDIOVASCULAR: Denies chest pains or palpitations.  RESPIRATORY: Denies cough, shortness of breath, or hemoptysis.  GASTROINTESTINAL: Denies nausea, vomiting or bloody stools.  GENITOURINARY: Denies frequency, urgency or dysuria.  MUSCULOSKELETAL: Denies joint pain, swelling or redness.  INTEGUMENTARY: Denies skin rashes or lesions.  NEUROLOGIC: Denies focal extremity numbness or weakness.  PSYCHIATRIC: Denies depression, bipolar disorder.  ENDOCRINE: Denies polyuria or polydipsia.  HEMATOLOGIC AND LYMPHATIC: Denies easy bruisability, bleeding, or swollen lymph nodes.  ALLERGY AND IMMUNOLOGIC: Denies seasonal allergies or history of immunodeficiency.   PHYSICAL EXAMINATION: VITAL SIGNS: Temperature 97.6, pulse 74, respirations 20, blood pressure 108/59, pulse ox 93%.  GENERAL: Reveals a slender African American female, currently in no acute distress.  HEENT: Normocephalic, atraumatic. Extraocular movements are intact. Pupils equal, round, and reactive to light. No scleral icterus. Conjunctivae are pink. No epistaxis noted. Gross hearing intact. Oral mucosa moist.  NECK: Supple without JVD or lymphadenopathy.  LUNGS: Clear to auscultation bilaterally with normal respiratory effort.  HEART: S1, S2 regular rate and rhythm. No murmurs, rubs or gallops appreciated.  ABDOMEN: Soft, nontender, nondistended. Bowel sounds positive. No rebound or guarding. No gross organomegaly  appreciated.  EXTREMITIES: No clubbing or cyanosis noted. 2+ pitting edema noted in bilateral lower extremities.  NEUROLOGIC: The  patient is awake, alert and oriented to time, person and place. Strength is 5/5 in both upper and lower extremities.  MUSCULOSKELETAL: No joint redness, swelling or tenderness appreciated.  GENITOURINARY: No suprapubic tenderness at this time.  PSYCHIATRIC: The patient with appropriate affect and appears in good insight into her current illness.   DIAGNOSTIC DATA: Laboratory: Sodium 138, potassium 3.6, chloride 109, CO2 19, BUN 77, creatinine 4.4, total protein 5.6, albumin 1.6, total bilirubin 0.2, direct bilirubin is 1, alkaline phosphatase 101, AST 19, ALT 11. Troponin x3 sets are negative. CBC shows WBC 2.7, hemoglobin 8.6, hematocrit 26, platelets 249,000. Urinalysis is unremarkable and is without proteinuria or hematuria.   IMPRESSION: This is an 81 year old African American female with past medical history of hypertension, diabetes mellitus, hyperlipidemia, history of uterine cancer status post chemotherapy, chronic kidney disease stage III with baseline eGFR of 44 who presented to [facility] with weakness.   PROBLEM LIST: 1.  Acute renal failure.  2.  Chronic kidney disease stage III, baseline eGFR of 44.  3.  Protein calorie malnutrition with an albumin of 1.6.  4.  Anemia, not otherwise specified.  5.  Leukopenia.   PLAN: The patient presents with an interesting case. She has several abnormal findings. In March her creatinine was 1.28 with an eGFR of 44. Renal function is significantly worse now and creatinine was 4.9 upon presentation. However, this is down to 4.4 with hydration. Agree with maintaining the patient on dopamine drip as well as IV fluid hydration for now. She does not appear to be in florid pulmonary edema at this time. However, we are awaiting further input from Dr. Fletcher Anon. Agree with renal ultrasound. We will proceed with additional work-up including SPEP, UPEP and ANA. We would also consider hematology consultation given the leukopenia and anemia at  this time. I would like to thank Dr. Manuella Ghazi for this kind referral. Further plan as the patient progresses.  ____________________________ Tama High, MD mnl:sb D: 09/04/2013 08:40:42 ET T: 09/04/2013 08:49:51 ET JOB#: [id number]  cc: Tama High, MD, <Dictator> Mariah Milling Jacyln Carmer MD ELECTRONICALLY SIGNED 09/07/2013 14:09

## 2014-07-31 NOTE — Consult Note (Signed)
PATIENT NAME:  Haley Holland, Haley Holland MR#:  [id number] DATE OF BIRTH:  1924/03/09  DATE OF CONSULTATION:  09/04/2013  REFERRING PHYSICIAN:  Dr. Manuella Ghazi. CONSULTING PHYSICIAN:  Muhammad A. Fletcher Anon, MD  PRIMARY CARE PHYSICIAN:  Dr. Benita Stabile.  REASON FOR CONSULTATION: Possible congestive heart failure.   HISTORY OF PRESENT ILLNESS: This is an 81 year old female with history of hypertension, diabetes and cervical cancer in the past. She presented to the Emergency Room with generalized weakness. She was found to be in acute renal failure with a creatinine of 4.9. Previous creatinine in March was 1.2. At that time, she presented with lower extremity edema. She was given furosemide. She reports poor oral intake. She denies chest pain or shortness of breath. She is overall a poor historian. She denies orthopnea or PND. She was placed on IV fluids and small dose of dopamine. BNP was only mildly elevated.   PAST MEDICAL HISTORY: 1.  Hypertension.  2.  Diabetes. 3.  Hyperlipidemia.  4.  History of uterine cancer, status post chemotherapy and radiation.   ALLERGIES: No known drug allergies.   SOCIAL HISTORY: Negative for smoking, alcohol or recreational drug use.   FAMILY HISTORY: Negative for heart disease.   HOME MEDICATIONS: None, but she did take furosemide. She claims to take 2 other medications, which are not known to me.   REVIEW OF SYSTEMS: A ten-point review of systems was performed. It is negative other than what is mentioned in the HPI.   PHYSICAL EXAMINATION: GENERAL: The patient appears to be malnourished and underweight. She is currently in no acute distress.  VITAL SIGNS: She is afebrile with a heart rate of 90, blood pressure 104/55 and oxygen saturation is 98% on room air.  HEENT: Normocephalic, atraumatic.  NECK: No JVD or carotid bruits.  RESPIRATORY: Normal respiratory effort with no use of accessory muscles. Auscultation reveals normal breath sounds.  CARDIOVASCULAR: Normal PMI.  Normal S1 and S2 with no gallops or murmurs.  ABDOMEN: Benign, nontender and nondistended.  EXTREMITIES:  With +2 edema up to the level of thigh.  SKIN: Warm and dry with no rash.  PSYCHIATRIC: She is alert, oriented x 3, but she is a poor historian.   LABORATORY AND DIAGNOSTIC DATA: ECG showed sinus rhythm with no significant ST or T wave changes. Labs showed a creatinine of 4.9 with a BNP of 2300. BUN was 76. Albumin was only 1.6. TSH was normal and troponin was normal. She is anemic with a hemoglobin of 8.6 and white cell count of 2.7.   IMPRESSION: 1.  Acute renal failure.  2.  Leg edema, unlikely to be due to congestive heart failure.  3.  Hypoalbuminemia.  4.  Anemia of unclear etiology.   RECOMMENDATIONS:  Based on her physical exam, I do not think this patient has congestive heart failure. There is no jugular venous distention and lungs are relatively clear. An echocardiogram was done which showed normal LV systolic function with only mild diastolic dysfunction. Pulmonary pressure was normal. The edema could be related to severe hypoalbuminemia. Etiology of renal failure also should be pursued. Multiple myeloma should be excluded. The other possibility for leg edema could be IVC or other central vein compression by an outside structure, especially with her previous history of cervical or uterine cancer. I suggest CT imaging of the pelvis and abdomen once renal function improves to baseline. Dopamine can be discontinued. I recommend continuing hydration. We will follow the patient as needed. Please call us with questions.  ____________________________ Mertie Clause Fletcher Anon, MD maa:dmm D: 09/04/2013 18:33:11 ET T: 09/04/2013 21:40:22 ET JOB#: [id number]  cc: Rogue Jury A. Fletcher Anon, MD, <Dictator> Wellington Hampshire MD ELECTRONICALLY SIGNED 09/15/2013 17:10

## 2015-10-09 IMAGING — CR DG CHEST 1V PORT
1 series · 1 of 1 positions shown · non-contrast
Comparison: 08/26/2013

CLINICAL DATA: Shortness of breath

EXAM:
PORTABLE CHEST - 1 VIEW

[ap]
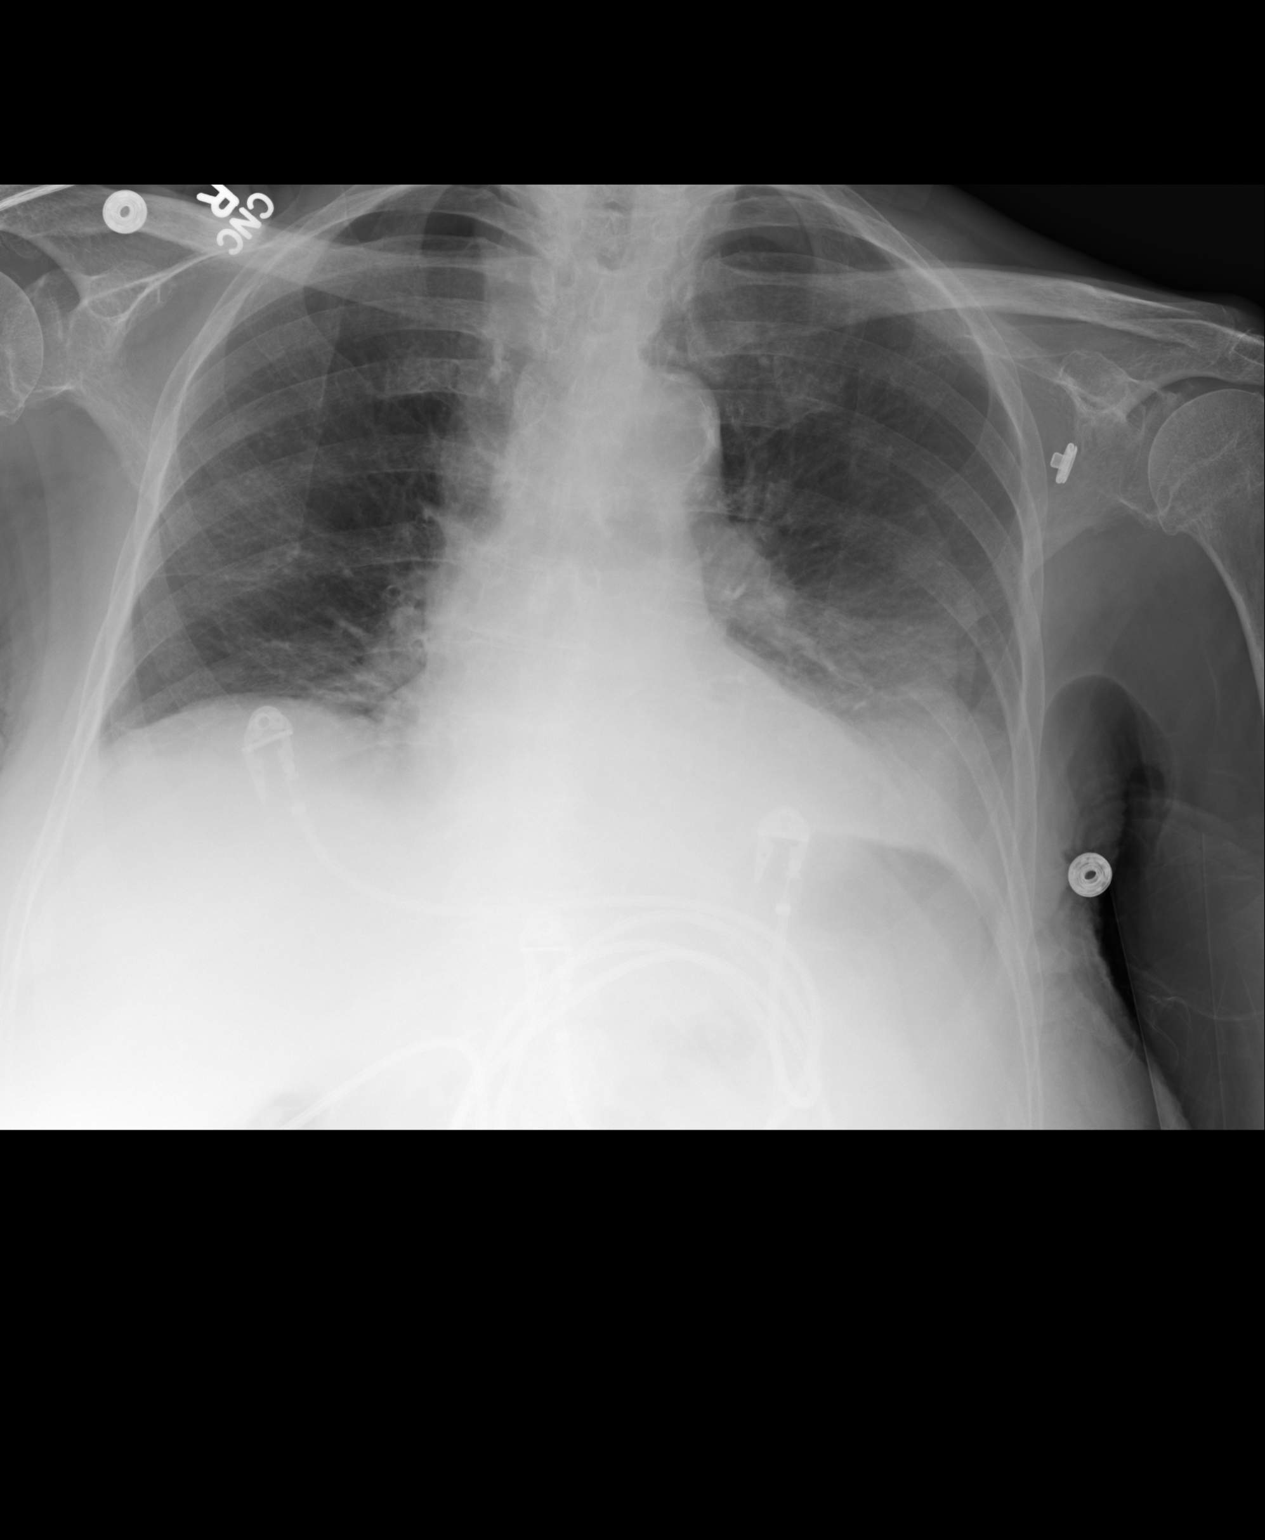

[1 of 1 positions shown; findings below may reference images not displayed]

FINDINGS: Cardiomediastinal silhouette is stable. Atherosclerotic
calcifications of thoracic aorta. There is small left pleural
effusion left basilar atelectasis or infiltrate. Trace right pleural
effusion with right basilar atelectasis. No pulmonary edema.
IMPRESSION: Small left pleural effusion with left basilar atelectasis or
infiltrate. Trace right pleural effusion with mild basilar
atelectasis. No pulmonary edema.
